# Patient Record
Sex: Male | Born: 1985 | Race: White | Hispanic: No | Marital: Married | Smoking: Never smoker
Health system: Southern US, Community
[De-identification: ages and names within clinical notes are randomized; demographics above are authoritative.]

## PROBLEM LIST (undated history)

## (undated) DIAGNOSIS — R519 Headache, unspecified: Secondary | ICD-10-CM

## (undated) HISTORY — DX: Headache, unspecified: R51.9

---

## 2006-01-15 HISTORY — PX: OTHER SURGICAL HISTORY: SHX169

## 2011-04-30 ENCOUNTER — Encounter (HOSPITAL_COMMUNITY): Payer: Self-pay | Admitting: Physical Medicine and Rehabilitation

## 2011-04-30 ENCOUNTER — Emergency Department (HOSPITAL_COMMUNITY)
Admission: EM | Admit: 2011-04-30 | Discharge: 2011-04-30 | Disposition: A | Discharge status: Home / Self Care | Payer: 59 | Attending: Emergency Medicine | Admitting: Emergency Medicine

## 2011-04-30 DIAGNOSIS — R002 Palpitations: Secondary | ICD-10-CM | POA: Insufficient documentation

## 2011-04-30 DIAGNOSIS — I499 Cardiac arrhythmia, unspecified: Secondary | ICD-10-CM | POA: Insufficient documentation

## 2011-04-30 DIAGNOSIS — I4892 Unspecified atrial flutter: Secondary | ICD-10-CM | POA: Insufficient documentation

## 2011-04-30 DIAGNOSIS — X58XXXA Exposure to other specified factors, initial encounter: Secondary | ICD-10-CM | POA: Insufficient documentation

## 2011-04-30 DIAGNOSIS — F17200 Nicotine dependence, unspecified, uncomplicated: Secondary | ICD-10-CM | POA: Insufficient documentation

## 2011-04-30 MED ORDER — ASPIRIN 81 MG PO CHEW
324.0000 mg | CHEWABLE_TABLET | Freq: Once | ORAL | Status: AC
  Administered 2011-04-30: 324 mg via ORAL
  Filled 2011-04-30: qty 4

## 2011-04-30 MED ORDER — ASPIRIN 81 MG PO CHEW: 325.0000 mg | CHEWABLE_TABLET | Freq: Every day | ORAL | Status: AC

## 2011-04-30 NOTE — ED Provider Notes (Signed)
History     CSN: [id number]  Arrival date & time 04/30/11  1121   First MD Initiated Contact with Patient 04/30/11 1423      Chief Complaint  Patient presents with  . Palpitations     Patient is a 26 y.o. male presenting with palpitations. The history is provided by the patient.  Palpitations  This is a new problem. The current episode started 6 to 12 hours ago. The problem occurs hourly. The problem has been gradually improving. The problem is associated with stress. Episode Length: several hours. Associated symptoms include irregular heartbeat. Pertinent negatives include no diaphoresis, no fever, no chest pressure, no near-syncope, no abdominal pain, no vomiting, no weakness, no cough and no shortness of breath. Risk factors include smoking/tobacco exposure. His past medical history does not include heart disease.    Pt presents for palpitations He reports this morning he noticed his heart racing and it seemed irregular No syncope No CP reported No h/o exertional syncope He has never had this before He does report starting to smoke cigarettes last week No drug use reported Does not use caffeine excessively No recent travel/surgery reported  PMH - none  No past surgical history on file.  History reviewed. No pertinent family history.  History  Substance Use Topics  . Smoking status: Current Some Day Smoker    Types: Cigarettes  . Smokeless tobacco: Not on file  . Alcohol Use: No      Review of Systems  Constitutional: Negative for fever and diaphoresis.  Respiratory: Negative for cough and shortness of breath.   Cardiovascular: Positive for palpitations. Negative for near-syncope.  Gastrointestinal: Negative for vomiting and abdominal pain.  Neurological: Negative for weakness.  All other systems reviewed and are negative.    Allergies  Review of patient's allergies indicates no known allergies.  Home Medications  No current outpatient prescriptions on  file.  BP 118/71  Temp(Src) 98 F (36.7 C) (Oral)  Resp 18  SpO2 97%  Physical Exam CONSTITUTIONAL: Well developed/well nourished HEAD AND FACE: Normocephalic/atraumatic EYES: EOMI/PERRL ENMT: Mucous membranes moist NECK: supple no meningeal signs SPINE:entire spine nontender CV: S1/S2 noted, no murmurs/rubs/gallops noted, regular LUNGS: Lungs are clear to auscultation bilaterally, no apparent distress ABDOMEN: soft, nontender, no rebound or guarding GU:no cva tenderness NEURO: Pt is awake/alert, moves all extremitiesx4 EXTREMITIES: pulses normal, full ROM SKIN: warm, color normal PSYCH: no abnormalities of mood noted  ED Course  Procedures   3:15 PM Initial EKG concerning aflutter Repeat EKG now in sinus He is well appearing, no distress Consulted cardiology  3:56 PM D/w dr Sharyn Lull We discussed EKG, and that he is currently in sinus rhythm He is in no distress, no other medical problems He recommended starting daily ASA and will see in office This was d/w patient He is agreeable with plan   MDM  Nursing notes reviewed and considered in documentation   Date: 04/30/2011 1132am  Rate: 95  Rhythm: atrial flutter  QRS Axis: normal  Intervals: normal  ST/T Wave abnormalities: nonspecific ST changes  Conduction Disutrbances:none  Narrative Interpretation:   Old EKG Reviewed: none available   Date: 04/30/2011 @1454   Rate: 62  Rhythm: normal sinus rhythm  QRS Axis: normal  Intervals: normal  ST/T Wave abnormalities: normal  Conduction Disutrbances:none  Narrative Interpretation:   Old EKG Reviewed: changes noted          Joya Gaskins, MD 04/30/11 1557

## 2011-04-30 NOTE — ED Notes (Signed)
Pt presents to department for evaluation of palpitations. States onset this morning @07 :00 after waking up. States he feels like his heart is "racing." Also states SOB. Denies chest pain at the time. Respirations unlabored. Speaking complete sentences. He is alert and oriented x4. Skin warm and dry. No signs of distress at the time.

## 2011-04-30 NOTE — Discharge Instructions (Signed)
Atrial Flutter  Atrial flutter is a heart rhythm that can cause the heart to beat very fast (tachycardia). It originates in the upper chambers of the heart (atria). In atrial flutter, the top chambers of the heart (atria) often beat much faster than the bottom chambers of the heart (ventricles). Atrial flutter has a regular "saw toothed" appearance in an EKG readout. An EKG is a test that records the electrical activity of the heart. Atrial flutter can cause the heart to beat up to 150 beats per minute (BPM). Atrial flutter can either be short lived (paroxysmal) or permanent.   CAUSES   Causes of atrial flutter can be many. Some of these include:   Heart related issues:   Heart attack (myocardial infarction).   Heart failure.   Heart valve problems.   Poorly controlled high blood pressure (hypertension).   Afteropen heart surgery.   Lung related issues:   A blood clot in the lungs (pulmonary embolism).   Chronic obstructive pulmonary disease (COPD). Medications used to treat COPD can attribute to atrial flutter.   Other related causes:   Hyperthyroidism.   Caffeine.   Some decongestant cold medications.   Low electrolyte levels such as potassium or magnesium.   Cocaine.  SYMPTOMS   An awareness of your heart beating rapidly (palpitations).   Shortness of breath.   Chest pain.   Low blood pressure (hypotension).   Dizziness or fainting.  DIAGNOSIS   Different tests can be performed to diagnose atrial flutter.    An EKG.   Holter monitor. This is a 24 hour recording of your heart rhythm. You will also be given a diary. Write down all symptoms that you have and what you were doing at the time you experienced symptoms.   Cardiac event monitor. This small device can be worn for up to 30 days. When you have heart symptoms, you will push a button on the device. This will then record your heart rhythm.   Echocardiogram. This is an imaging test to look at your heart. Your caregiver will look at your  heart valves and the ventricles.   Stress Test. This test can help determine if the atrial flutter is related to exercise or if coronary artery disease is present.   Laboratory studies will look at certain blood levels like:   Complete blood count (CBC).   Potassium.   Magnesium.   Thyroid function.  TREATMENT   Treatment of atrial flutter varies. A combination of therapies may be used or sometimes atrial flutter may need only 1 type of treatment.   Lab work:  If your blood work, such as your electrolytes (potassium, magnesium) or your thyroid function tests are abnormal, your caregiver will treat them accordingly.   Medication:   There are several different types of medications that can convert your heart to a normal rhythm and prevent atrial flutter from reoccurring.   Nonsurgical procedures:  Nonsurgical techniques may be used to control atrial flutter. Some examples include:   Cardioversion. This technique uses either drugs or an electrical shock to restore a normal heart rhythm:   Cardioversion drugs may be given through an intravenous (IV) line to help "reset" the heart rhythm.   In electrical cardioversion, your caregiver shocks your heart with electrical energy. This helps to reset the heartbeat to a normal rhythm.   Ablation. If atrial flutter is a persistent problem, an ablation may be needed. This procedure is done under mild sedation. High frequency radio-wave energy is used to   destroy the area of heart tissue responsible for atrial flutter.  SEEK IMMEDIATE MEDICAL CARE IF:    Dizziness.   Near fainting or fainting.   Shortness of breath.   Chest pain or pressure.   Sudden nausea or vomiting.   Profuse sweating.  If you have the above symptoms, call your local emergency service immediately! Do not drive yourself to the hospital.  MAKE SURE YOU:    Understand these instructions.   Will watch your condition.   Will get help right away if you are not doing well or get worse.  Document  Released: 05/20/2008 Document Revised: 12/21/2010 Document Reviewed: 05/20/2008  [employer] Patient Information 2012 [employer].

## 2015-04-28 ENCOUNTER — Encounter: Payer: Self-pay | Admitting: Sports Medicine

## 2015-04-28 ENCOUNTER — Ambulatory Visit (INDEPENDENT_AMBULATORY_CARE_PROVIDER_SITE_OTHER): Payer: Managed Care, Other (non HMO) | Admitting: Sports Medicine

## 2015-04-28 DIAGNOSIS — L600 Ingrowing nail: Secondary | ICD-10-CM

## 2015-04-28 DIAGNOSIS — M79674 Pain in right toe(s): Secondary | ICD-10-CM | POA: Diagnosis not present

## 2015-04-28 NOTE — Progress Notes (Signed)
Patient ID: Justin Huffman, male   DOB: January 17, 1985, 30 y.o.   MRN: [id number] Subjective: Justin Huffman is a 30 y.o. male patient presents to office today complaining of a painful incurvated, swollen lateral>medial nail border of the 1st toe on the right foot. This has been present for several weeks. Patient has treated this by trimming. Admits that he had a past procedure on the left side and is well aware that he will need a procedure for the right side. However, he has just been putting it off. States that it is likely from his excessive activities of building a fence from being up on his toes could've caused excessive pressure and contributed to the nail growing into the skin. Patient denies fever/chills/nausea/vomitting/any other related constitutional symptoms at this time.  There are no active problems to display for this patient.   No current outpatient prescriptions on file prior to visit.   No current facility-administered medications on file prior to visit.    No Known Allergies  Objective:  There were no vitals filed for this visit.  General: Well developed, nourished, in no acute distress, alert and oriented x3   Dermatology: Skin is warm, dry and supple bilateral. Right hallux nail appears to be  severely incurvated with hyperkeratosis formation at the distal aspects of the lateral>medial nail border. (-) Erythema. (+) Edema. (-) serosanguous drainage present. The remaining nails appear unremarkable at this time. There are no open sores, lesions or other signs of infection present.  Vascular: Dorsalis Pedis artery and Posterior Tibial artery pedal pulses are 2/4 bilateral with immedate capillary fill time. Pedal hair growth present. No lower extremity edema.   Neruologic: Grossly intact via light touch bilateral.  Musculoskeletal: Tenderness to palpation of the Right hallux lateral>medial nail fold(s). Muscular strength within normal limits in all groups bilateral.    Assesement and Plan: Problem List Items Addressed This Visit    None    Visit Diagnoses    Ingrown nail    -  Primary    right lateral>medial hallux    Toe pain, right           -Discussed treatment alternatives and plan of care; Explained permanent/temporary nail avulsion and post procedure course to patient. Patient opt for permanent nail avulsion at the lateral nail fold of the right hallux and also asked me to just trim the medial nail at the right hallux.  - After a verbal consent, injected 3 ml of a 50:50 mixture of 2% plain  lidocaine and 0.5% plain marcaine in a normal hallux block fashion. Next, a  betadine prep was performed. Anesthesia was tested and found to be appropriate.  The offending right hallux lateral nail border was then incised from the hyponychium to the epinychium. The offending nail border was removed and cleared from the field. The area was curretted for any remaining nail or spicules. Phenol application performed. Following an aggressive slant back was performed at the medial hallux nail of right hallux and then areas were then flushed with alcohol and dressed with antibiotic cream and a dry sterile dressing. -Patient was instructed to leave the dressing intact for today and begin soaking  in a weak solution of betadine and water tomorrow. Patient was instructed to  soak for 15 minutes each day and apply neosporin and a gauze or bandaid dressing each day. -Patient was instructed to monitor the toe for signs of infection and return to office or go to ER if toe becomes red, hot or swollen. -  Recommend ice, elevation, Tylenol or Motrin if needed for pain -Patient is to return in 1-2 weeks for follow up care or sooner if problems arise.  Asencion Islam, DPM

## 2015-04-28 NOTE — Patient Instructions (Signed)

## 2015-05-12 ENCOUNTER — Ambulatory Visit: Payer: Managed Care, Other (non HMO) | Admitting: Sports Medicine

## 2015-05-19 ENCOUNTER — Encounter: Payer: Self-pay | Admitting: Sports Medicine

## 2015-05-19 ENCOUNTER — Ambulatory Visit (INDEPENDENT_AMBULATORY_CARE_PROVIDER_SITE_OTHER): Payer: Managed Care, Other (non HMO) | Admitting: Sports Medicine

## 2015-05-19 DIAGNOSIS — M79674 Pain in right toe(s): Secondary | ICD-10-CM

## 2015-05-19 DIAGNOSIS — Z9889 Other specified postprocedural states: Secondary | ICD-10-CM

## 2015-05-19 NOTE — Progress Notes (Signed)
Patient ID: Justin Huffman, male   DOB: 08/02/1985, 30 y.o.   MRN: [id number] Subjective: Justin Huffman is a 30 y.o. male patient returns to office today for follow up evaluation after having Right Hallux lateral permanent nail avulsion performed on 04-28-15. Patient has been soaking using betadine and applying topical antibiotic covered with bandaid daily until a few days ago. Patient denies fever/chills/nausea/vomitting/any other related constitutional symptoms at this time.  There are no active problems to display for this patient.   Current Outpatient Prescriptions on File Prior to Visit  Medication Sig Dispense Refill  . levocetirizine (XYZAL) 2.5 MG/5ML solution Take 2.5 mg by mouth as needed for allergies.     No current facility-administered medications on file prior to visit.    No Known Allergies  Objective:  General: Well developed, nourished, in no acute distress, alert and oriented x3   Dermatology: Skin is warm, dry and supple bilateral. Right  hallux lateral nail bed appears to be clean, dry, with mild granular tissue and surrounding eschar/scab. (-) Erythema. (-) Edema. (-) serosanguous drainage present. The remaining nails appear unremarkable at this time. There are no other lesions or other signs of infection present.  Neurovascular status: Intact. No lower extremity swelling; No pain with calf compression bilateral.  Musculoskeletal: Decreased tenderness to palpation of the Right hallux lateral nail fold. Muscular strength within normal limits bilateral.   Assesement and Plan: Problem List Items Addressed This Visit    None    Visit Diagnoses    S/P nail surgery    -  Primary    PNA right hallux lateral margin    Toe pain, right           -Examined patient  -Cleansed right hallux lateral nail fold and gently scrubbed with peroxide and curetted away eschar at site and applied antibiotic cream covered with bandaid.  -Discussed plan of care with  patient. -Soaks no longer needed -Bandaid as needed. May leave open to air at night. -Educated patient on long term care after nail surgery. -Patient was instructed to monitor the toe for reoccurrence and signs of infection; Patient advised to return to office or go to ER if toe becomes red, hot or swollen. -Patient is to return as needed or sooner if problems arise.  Asencion Islamitorya Indiyah Paone, DPM

## 2017-01-03 DIAGNOSIS — M79661 Pain in right lower leg: Secondary | ICD-10-CM | POA: Diagnosis not present

## 2017-01-03 DIAGNOSIS — E668 Other obesity: Secondary | ICD-10-CM | POA: Diagnosis not present

## 2017-01-03 DIAGNOSIS — R8290 Unspecified abnormal findings in urine: Secondary | ICD-10-CM | POA: Diagnosis not present

## 2017-01-03 DIAGNOSIS — Z1389 Encounter for screening for other disorder: Secondary | ICD-10-CM | POA: Diagnosis not present

## 2017-01-03 DIAGNOSIS — Z0000 Encounter for general adult medical examination without abnormal findings: Secondary | ICD-10-CM | POA: Diagnosis not present

## 2017-01-03 DIAGNOSIS — Z6831 Body mass index (BMI) 31.0-31.9, adult: Secondary | ICD-10-CM | POA: Diagnosis not present

## 2017-03-25 DIAGNOSIS — J329 Chronic sinusitis, unspecified: Secondary | ICD-10-CM | POA: Diagnosis not present

## 2017-03-25 DIAGNOSIS — Z6831 Body mass index (BMI) 31.0-31.9, adult: Secondary | ICD-10-CM | POA: Diagnosis not present

## 2017-03-25 DIAGNOSIS — H748X1 Other specified disorders of right middle ear and mastoid: Secondary | ICD-10-CM | POA: Diagnosis not present

## 2017-06-11 DIAGNOSIS — H04123 Dry eye syndrome of bilateral lacrimal glands: Secondary | ICD-10-CM | POA: Diagnosis not present

## 2017-07-02 DIAGNOSIS — Z6830 Body mass index (BMI) 30.0-30.9, adult: Secondary | ICD-10-CM | POA: Diagnosis not present

## 2017-07-02 DIAGNOSIS — H04129 Dry eye syndrome of unspecified lacrimal gland: Secondary | ICD-10-CM | POA: Diagnosis not present

## 2017-07-02 DIAGNOSIS — J329 Chronic sinusitis, unspecified: Secondary | ICD-10-CM | POA: Diagnosis not present

## 2017-07-02 DIAGNOSIS — E668 Other obesity: Secondary | ICD-10-CM | POA: Diagnosis not present

## 2017-07-03 ENCOUNTER — Other Ambulatory Visit: Payer: Self-pay | Admitting: Internal Medicine

## 2017-07-03 DIAGNOSIS — J329 Chronic sinusitis, unspecified: Secondary | ICD-10-CM

## 2017-08-28 DIAGNOSIS — R51 Headache: Secondary | ICD-10-CM | POA: Diagnosis not present

## 2017-08-28 DIAGNOSIS — J310 Chronic rhinitis: Secondary | ICD-10-CM | POA: Diagnosis not present

## 2017-08-28 DIAGNOSIS — J343 Hypertrophy of nasal turbinates: Secondary | ICD-10-CM | POA: Diagnosis not present

## 2018-02-03 DIAGNOSIS — M2560 Stiffness of unspecified joint, not elsewhere classified: Secondary | ICD-10-CM | POA: Diagnosis not present

## 2018-02-03 DIAGNOSIS — R51 Headache: Secondary | ICD-10-CM | POA: Diagnosis not present

## 2018-02-03 DIAGNOSIS — M542 Cervicalgia: Secondary | ICD-10-CM | POA: Diagnosis not present

## 2018-02-03 DIAGNOSIS — M6281 Muscle weakness (generalized): Secondary | ICD-10-CM | POA: Diagnosis not present

## 2018-02-20 ENCOUNTER — Other Ambulatory Visit: Payer: Self-pay | Admitting: Otolaryngology

## 2018-02-20 DIAGNOSIS — J329 Chronic sinusitis, unspecified: Secondary | ICD-10-CM

## 2018-03-13 DIAGNOSIS — M542 Cervicalgia: Secondary | ICD-10-CM | POA: Diagnosis not present

## 2018-03-13 DIAGNOSIS — M6281 Muscle weakness (generalized): Secondary | ICD-10-CM | POA: Diagnosis not present

## 2018-03-13 DIAGNOSIS — R51 Headache: Secondary | ICD-10-CM | POA: Diagnosis not present

## 2018-03-13 DIAGNOSIS — M2560 Stiffness of unspecified joint, not elsewhere classified: Secondary | ICD-10-CM | POA: Diagnosis not present

## 2018-04-08 DIAGNOSIS — M6281 Muscle weakness (generalized): Secondary | ICD-10-CM | POA: Diagnosis not present

## 2018-04-08 DIAGNOSIS — M542 Cervicalgia: Secondary | ICD-10-CM | POA: Diagnosis not present

## 2018-04-08 DIAGNOSIS — M2560 Stiffness of unspecified joint, not elsewhere classified: Secondary | ICD-10-CM | POA: Diagnosis not present

## 2018-04-08 DIAGNOSIS — R51 Headache: Secondary | ICD-10-CM | POA: Diagnosis not present

## 2018-06-17 DIAGNOSIS — Z20828 Contact with and (suspected) exposure to other viral communicable diseases: Secondary | ICD-10-CM | POA: Diagnosis not present

## 2018-07-09 DIAGNOSIS — Z20828 Contact with and (suspected) exposure to other viral communicable diseases: Secondary | ICD-10-CM | POA: Diagnosis not present

## 2018-07-11 ENCOUNTER — Other Ambulatory Visit: Payer: Self-pay

## 2018-07-15 ENCOUNTER — Ambulatory Visit
Admission: RE | Admit: 2018-07-15 | Discharge: 2018-07-15 | Disposition: A | Discharge status: Home / Self Care | Payer: BC Managed Care – PPO | Source: Ambulatory Visit | Attending: Otolaryngology | Admitting: Otolaryngology

## 2018-07-15 DIAGNOSIS — J329 Chronic sinusitis, unspecified: Secondary | ICD-10-CM | POA: Diagnosis not present

## 2018-08-25 ENCOUNTER — Encounter: Payer: Self-pay | Admitting: *Deleted

## 2018-08-27 ENCOUNTER — Other Ambulatory Visit: Payer: Self-pay

## 2018-08-27 ENCOUNTER — Ambulatory Visit: Payer: BC Managed Care – PPO | Admitting: Diagnostic Neuroimaging

## 2018-08-27 ENCOUNTER — Encounter: Payer: Self-pay | Admitting: Diagnostic Neuroimaging

## 2018-08-27 VITALS — BP 136/88 | HR 77 | Temp 96.8°F | Ht 74.0 in | Wt 241.0 lb

## 2018-08-27 DIAGNOSIS — R51 Headache: Secondary | ICD-10-CM

## 2018-08-27 DIAGNOSIS — G43009 Migraine without aura, not intractable, without status migrainosus: Secondary | ICD-10-CM

## 2018-08-27 DIAGNOSIS — R519 Headache, unspecified: Secondary | ICD-10-CM

## 2018-08-27 MED ORDER — RIZATRIPTAN BENZOATE 10 MG PO TBDP: 10.0000 mg | ORAL_TABLET | ORAL | 11 refills | Status: AC | PRN

## 2018-08-27 MED ORDER — TOPIRAMATE 50 MG PO TABS: 50.0000 mg | ORAL_TABLET | Freq: Two times a day (BID) | ORAL | 12 refills | Status: AC

## 2018-08-27 NOTE — Patient Instructions (Signed)
-   check MRI brain (with and without)   - MIGRAINE PREVENTION --> start topiramate 50mg  at bedtime; after 1 week increase to twice a day; drink plenty of water  - MIGRAINE RESCUE --> rizatriptan 10mg  as needed for breakthrough headache; may repeat x 1 after 2 hours; max 2 tabs per day or 8 per month

## 2018-08-27 NOTE — Progress Notes (Signed)
[facility]  PATIENT: Justin Huffman DOB: December 10, 1985  REFERRING CLINICIAN: Teoh HISTORY FROM: patient  REASON FOR VISIT: new consult    HISTORICAL  CHIEF COMPLAINT:  Chief Complaint  Patient presents with  . Headache    rm 7 New Pt,  "since Feb 2019 having daily headaches, Tylenol gives a little relief"    HISTORY OF PRESENT ILLNESS:   33 year old male here for evaluation of headaches.  Headache started in February 2019.  He describes head pressure, squeezing sensation on the left side of his head, eye, ear, neck and shoulder.  Symptoms can last 2 to 3 hours at a time.  He has associated photophobia, dizziness, word finding difficulty.  Now patient having headaches on a daily basis.  Initially he felt some sinus pressure and ear pressure sensation, went to ENT for evaluation.  No specific pathology was found.  Caffeine tends to trigger these headaches.  Looking at computer screen tends to trigger the headaches.  Patient has family history of migraine in his mother.  Patient does not have any similar history of headaches prior to 2019.  He has tried Tylenol and muscle relaxers without relief.  No other prodromal accidents injuries or traumas.  No change in stress sleep or exercise.   REVIEW OF SYSTEMS: Full 14 system review of systems performed and negative with exception of: As per HPI.  ALLERGIES: No Known Allergies  HOME MEDICATIONS: Outpatient Medications Prior to Visit  Medication Sig Dispense Refill  . IBU 400 MG tablet TAKE 1 TABLET BY MOUTH EVERY 8 HOURS FOR 3 DAYS, TAKE 1 TABLET EVERY TWELVE (12) HOURS DAILY FOR 3 DAYS, TAKE 1 TABLET 3 TIMES DAILY AS NEED    . levocetirizine (XYZAL) 2.5 MG/5ML solution Take 2.5 mg by mouth as needed for allergies.    . methocarbamol (ROBAXIN) 750 MG tablet TAKE 1-2 TABLETS BY MOUTH EVERY NIGHT AT BEDTIME AS NEEDED     No facility-administered medications prior to visit.     PAST MEDICAL HISTORY: Past  Medical History:  Diagnosis Date  . Headache     PAST SURGICAL HISTORY: Past Surgical History:  Procedure Laterality Date  . arm surgery Right 2008   ligament replacement    FAMILY HISTORY: Family History  Problem Relation Age of Onset  . Migraines Mother   . Lung cancer Maternal Grandmother        smoker    SOCIAL HISTORY: Social History   Socioeconomic History  . Marital status: Married    Spouse name: Not on file  . Number of children: 1  . Years of education: Not on file  . Highest education level: Bachelor's degree (e.g., BA, AB, BS)  Occupational History    Comment: Estate agent [place]  Social Needs  . Financial resource strain: Not on file  . Food insecurity    Worry: Not on file    Inability: Not on file  . Transportation needs    Medical: Not on file    Non-medical: Not on file  Tobacco Use  . Smoking status: Never Smoker  . Smokeless tobacco: Never Used  Substance and Sexual Activity  . Alcohol use: No    Comment: 08/2018 1-2 x week or less  . Drug use: No  . Sexual activity: Not on file  Lifestyle  . Physical activity    Days per week: Not on file    Minutes per session: Not on file  . Stress: Not on file  Relationships  .  Social Musicianconnections    Talks on phone: Not on file    Gets together: Not on file    Attends religious service: Not on file    Active member of club or organization: Not on file    Attends meetings of clubs or organizations: Not on file    Relationship status: Not on file  . Intimate partner violence    Fear of current or ex partner: Not on file    Emotionally abused: Not on file    Physically abused: Not on file    Forced sexual activity: Not on file  Other Topics Concern  . Not on file  Social History Narrative   Lives with wife, child   Caffeine- 32 oz coffee     PHYSICAL EXAM  GENERAL EXAM/CONSTITUTIONAL: Vitals:  Vitals:   08/27/18 0819  BP: 136/88  Pulse: 77  Temp:  (!) 96.8 F (36 C)  Weight: 241 lb (109.3 kg)  Height: 6\' 2"  (1.88 m)     Body mass index is 30.94 kg/m. Wt Readings from Last 3 Encounters:  08/27/18 241 lb (109.3 kg)     Patient is in no distress; well developed, nourished and groomed; neck is supple  CARDIOVASCULAR:  Examination of carotid arteries is normal; no carotid bruits  Regular rate and rhythm, no murmurs  Examination of peripheral vascular system by observation and palpation is normal  EYES:  Ophthalmoscopic exam of optic discs and posterior segments is normal; no papilledema or hemorrhages  No exam data present  MUSCULOSKELETAL:  Gait, strength, tone, movements noted in Neurologic exam below  NEUROLOGIC: MENTAL STATUS:  No flowsheet data found.  awake, alert, oriented to person, place and time  recent and remote memory intact  normal attention and concentration  language fluent, comprehension intact, naming intact  fund of knowledge appropriate  CRANIAL NERVE:   2nd - no papilledema on fundoscopic exam  2nd, 3rd, 4th, 6th - pupils equal and reactive to light, visual fields full to confrontation, extraocular muscles intact, no nystagmus  5th - facial sensation symmetric  7th - facial strength symmetric; INTERMITTENT LEFT EYE BLEPHAROSPASM  8th - hearing intact  9th - palate elevates symmetrically, uvula midline  11th - shoulder shrug symmetric  12th - tongue protrusion midline  MOTOR:   normal bulk and tone, full strength in the BUE, BLE  SENSORY:   normal and symmetric to light touch, temperature, vibration  COORDINATION:   finger-nose-finger, fine finger movements normal  REFLEXES:   deep tendon reflexes present and symmetric  GAIT/STATION:   narrow based gait     DIAGNOSTIC DATA (LABS, IMAGING, TESTING) - I reviewed patient records, labs, notes, testing and imaging myself where available.  No results found for: WBC, HGB, HCT, MCV, PLT No results found for:  NA, K, CL, CO2, GLUCOSE, BUN, CREATININE, CALCIUM, PROT, ALBUMIN, AST, ALT, ALKPHOS, BILITOT, GFRNONAA, GFRAA No results found for: CHOL, HDL, LDLCALC, LDLDIRECT, TRIG, CHOLHDL No results found for: [id number] No results found for: VITAMINB12 No results found for: TSH   07/15/18 CT sinuses - Negative examination. No evidence of inflammatory sinus disease. Incidental retention cyst at the maxillary sinus floors.    ASSESSMENT AND PLAN  33 y.o. year old male here with new onset left-sided headaches associated with photophobia, dizziness, word finding difficulties, since February 2019.  Most likely represents migraine without aura.  Will check MRI of the brain to rule out other secondary causes.  Dx:  1. New onset of headaches   2.  Migraine without aura and without status migrainosus, not intractable     PLAN:  - check MRI brain (with and without); rule out mass, inflamm, infarct  - MIGRAINE PREVENTION --> start topiramate 50mg  at bedtime; after 1 week increase to twice a day; drink plenty of water  - MIGRAINE RESCUE --> rizatriptan 10mg  as needed for breakthrough headache; may repeat x 1 after 2 hours; max 2 tabs per day or 8 per month   Orders Placed This Encounter  Procedures  . MR BRAIN W WO CONTRAST   Meds ordered this encounter  Medications  . rizatriptan (MAXALT-MLT) 10 MG disintegrating tablet    Sig: Take 1 tablet (10 mg total) by mouth as needed for migraine. May repeat in 2 hours if needed    Dispense:  9 tablet    Refill:  11  . topiramate (TOPAMAX) 50 MG tablet    Sig: Take 1 tablet (50 mg total) by mouth 2 (two) times daily.    Dispense:  60 tablet    Refill:  12   Return in about 4 months (around 12/27/2018) for with NP (Amy Lomax).    Suanne MarkerVIKRAM R. , MD 08/27/2018, 9:08 AM Certified in Neurology, Neurophysiology and Neuroimaging  [facility] [address], [address] [place], [place] [address] [phone]

## 2018-08-28 ENCOUNTER — Telehealth: Payer: Self-pay | Admitting: Diagnostic Neuroimaging

## 2018-08-28 NOTE — Telephone Encounter (Signed)
LVM for pt to call back about scheduling mri  [employer] Auth: [employer] via [employer] website  °

## 2018-08-28 NOTE — Telephone Encounter (Signed)
I left a voicemail for patient to return my call.

## 2018-08-28 NOTE — Telephone Encounter (Signed)
Pt has called Justin Huffman back, he is asking for a call back °

## 2018-10-13 DIAGNOSIS — M546 Pain in thoracic spine: Secondary | ICD-10-CM | POA: Diagnosis not present

## 2018-10-13 DIAGNOSIS — M542 Cervicalgia: Secondary | ICD-10-CM | POA: Diagnosis not present

## 2018-12-28 NOTE — Progress Notes (Deleted)
PATIENT: Justin Huffman DOB: [birth year]  REASON FOR VISIT: follow up HISTORY FROM: patient  No chief complaint on file.    HISTORY OF PRESENT ILLNESS: Today 12/28/18 Justin Huffman is a 33 y.o. male here today for follow up for headaches. He was started on topiramate 50mg  BID and rizatriptan for abortive therapy in 08/2018. MRI    HISTORY: (copied from Dr 09/2018 note on 08/27/2018)  33 year old male here for evaluation of headaches.  Headache started in February 2019.  He describes head pressure, squeezing sensation on the left side of his head, eye, ear, neck and shoulder.  Symptoms can last 2 to 3 hours at a time.  He has associated photophobia, dizziness, word finding difficulty.  Now patient having headaches on a daily basis.  Initially he felt some sinus pressure and ear pressure sensation, went to ENT for evaluation.  No specific pathology was found.  Caffeine tends to trigger these headaches.  Looking at computer screen tends to trigger the headaches.  Patient has family history of migraine in his mother.  Patient does not have any similar history of headaches prior to 2019.  He has tried Tylenol and muscle relaxers without relief.  No other prodromal accidents injuries or traumas.  No change in stress sleep or exercise.    REVIEW OF SYSTEMS: Out of a complete 14 system review of symptoms, the patient complains only of the following symptoms, and all other reviewed systems are negative.  ALLERGIES: No Known Allergies  HOME MEDICATIONS: Outpatient Medications Prior to Visit  Medication Sig Dispense Refill  . IBU 400 MG tablet TAKE 1 TABLET BY MOUTH EVERY 8 HOURS FOR 3 DAYS, TAKE 1 TABLET EVERY TWELVE (12) HOURS DAILY FOR 3 DAYS, TAKE 1 TABLET 3 TIMES DAILY AS NEED    . levocetirizine (XYZAL) 2.5 MG/5ML solution Take 2.5 mg by mouth as needed for allergies.    . methocarbamol (ROBAXIN) 750 MG tablet TAKE 1-2 TABLETS BY MOUTH EVERY NIGHT AT BEDTIME AS  NEEDED    . rizatriptan (MAXALT-MLT) 10 MG disintegrating tablet Take 1 tablet (10 mg total) by mouth as needed for migraine. May repeat in 2 hours if needed 9 tablet 11  . topiramate (TOPAMAX) 50 MG tablet Take 1 tablet (50 mg total) by mouth 2 (two) times daily. 60 tablet 12   No facility-administered medications prior to visit.    PAST MEDICAL HISTORY: Past Medical History:  Diagnosis Date  . Headache     PAST SURGICAL HISTORY: Past Surgical History:  Procedure Laterality Date  . arm surgery Right 2008   ligament replacement    FAMILY HISTORY: Family History  Problem Relation Age of Onset  . Migraines Mother   . Lung cancer Maternal Grandmother        smoker    SOCIAL HISTORY: Social History   Socioeconomic History  . Marital status: Married    Spouse name: Not on file  . Number of children: 1  . Years of education: Not on file  . Highest education level: Bachelor's degree (e.g., BA, AB, BS)  Occupational History    Comment: 2009 [employer]  Tobacco Use  . Smoking status: Never Smoker  . Smokeless tobacco: Never Used  Substance and Sexual Activity  . Alcohol use: No    Comment: 08/2018 1-2 x week or less  . Drug use: No  . Sexual activity: Not on file  Other Topics Concern  . Not on file  Social History Narrative  Lives with wife, child   Caffeine- 32 oz coffee   Social Determinants of Health   Financial Resource Strain:   . Difficulty of Paying Living Expenses: Not on file  Food Insecurity:   . Worried About Charity fundraiser in the Last Year: Not on file  . Ran Out of Food in the Last Year: Not on file  Transportation Needs:   . Lack of Transportation (Medical): Not on file  . Lack of Transportation (Non-Medical): Not on file  Physical Activity:   . Days of Exercise per Week: Not on file  . Minutes of Exercise per Session: Not on file  Stress:   . Feeling of Stress : Not on file  Social Connections:    . Frequency of Communication with Friends and Family: Not on file  . Frequency of Social Gatherings with Friends and Family: Not on file  . Attends Religious Services: Not on file  . Active Member of Clubs or Organizations: Not on file  . Attends Archivist Meetings: Not on file  . Marital Status: Not on file  Intimate Partner Violence:   . Fear of Current or Ex-Partner: Not on file  . Emotionally Abused: Not on file  . Physically Abused: Not on file  . Sexually Abused: Not on file      PHYSICAL EXAM  There were no vitals filed for this visit. There is no height or weight on file to calculate BMI.  Generalized: Well developed, in no acute distress  Cardiology: normal rate and rhythm, no murmur noted Neurological examination  Mentation: Alert oriented to time, place, history taking. Follows all commands speech and language fluent Cranial nerve II-XII: Pupils were equal round reactive to light. Extraocular movements were full, visual field were full on confrontational test. Facial sensation and strength were normal. Uvula tongue midline. Head turning and shoulder shrug  were normal and symmetric. Motor: The motor testing reveals 5 over 5 strength of all 4 extremities. Good symmetric motor tone is noted throughout.  Sensory: Sensory testing is intact to soft touch on all 4 extremities. No evidence of extinction is noted.  Coordination: Cerebellar testing reveals good finger-nose-finger and heel-to-shin bilaterally.  Gait and station: Gait is normal. Tandem gait is normal. Romberg is negative. No drift is seen.  Reflexes: Deep tendon reflexes are symmetric and normal bilaterally.   DIAGNOSTIC DATA (LABS, IMAGING, TESTING) - I reviewed patient records, labs, notes, testing and imaging myself where available.  No flowsheet data found.   No results found for: WBC, HGB, HCT, MCV, PLT No results found for: NA, K, CL, CO2, GLUCOSE, BUN, CREATININE, CALCIUM, PROT, ALBUMIN, AST,  ALT, ALKPHOS, BILITOT, GFRNONAA, GFRAA No results found for: CHOL, HDL, LDLCALC, LDLDIRECT, TRIG, CHOLHDL No results found for: HGBA1C No results found for: VITAMINB12 No results found for: TSH     ASSESSMENT AND PLAN 33 y.o. year old male  has a past medical history of Headache. here with ***  No diagnosis found.     No orders of the defined types were placed in this encounter.    No orders of the defined types were placed in this encounter.     I spent 15 minutes with the patient. 50% of this time was spent counseling and educating patient on plan of care and medications.    Debbora Presto, FNP-C 12/28/2018, 3:09 PM [facility] [address], [place], [place] [address] [phone]

## 2018-12-29 ENCOUNTER — Ambulatory Visit: Payer: BC Managed Care – PPO | Admitting: Family Medicine

## 2018-12-29 ENCOUNTER — Encounter: Payer: Self-pay | Admitting: Family Medicine

## 2018-12-29 ENCOUNTER — Telehealth: Payer: Self-pay

## 2018-12-29 DIAGNOSIS — G43009 Migraine without aura, not intractable, without status migrainosus: Secondary | ICD-10-CM

## 2018-12-29 NOTE — Telephone Encounter (Signed)
Patient was a no call/no show for their appointment today.   

## 2019-07-28 IMAGING — CT CT MAXILLOFACIAL WITHOUT CONTRAST
4 of 5 series · 13 of 47 positions shown, 14 images · non-contrast
Comparison: None.

CLINICAL DATA: Fusion protocol for chronic sinusitis.

EXAM:
CT MAXILLOFACIAL WITHOUT CONTRAST
TECHNIQUE: Multidetector CT images of the paranasal sinuses were obtained using
the standard protocol without intravenous contrast.

[Series 2: sinus 2.00 hr60 s3 axial · axial · 0.43mm/px · z∈[-680,-610]mm · 3 of 71 slices shown]
[im 18/71  bone]
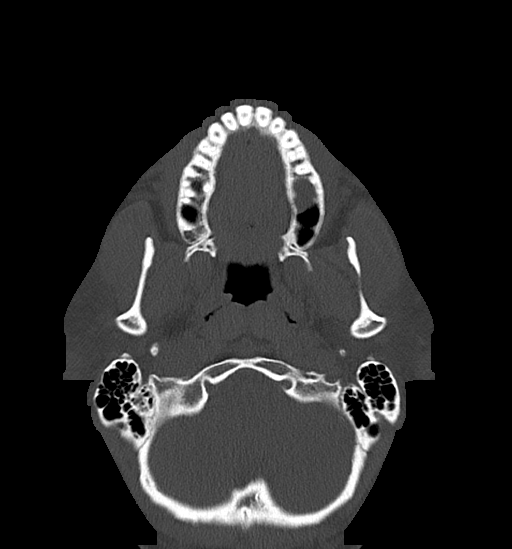
[im 36/71  bone]
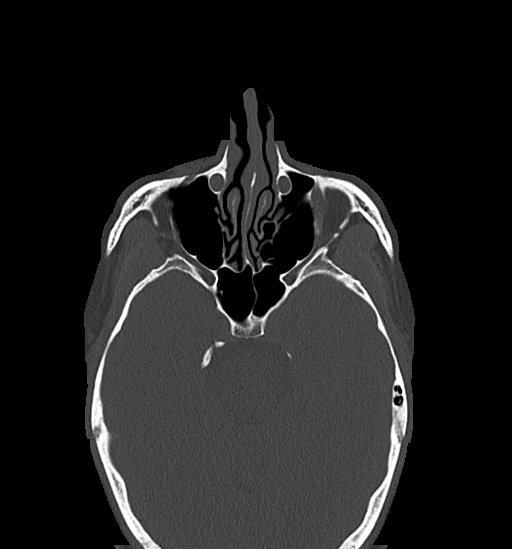
[im 53/71  bone]
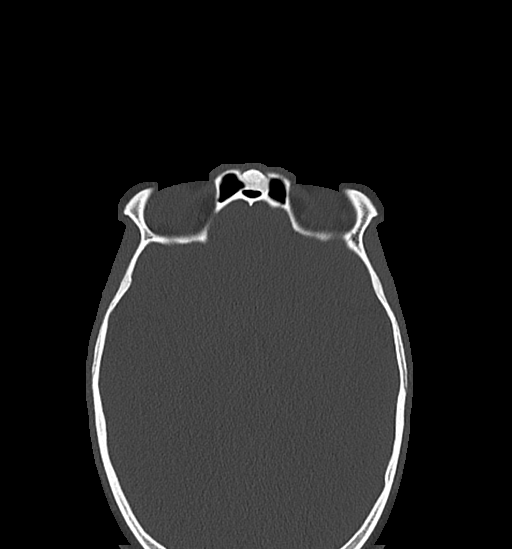

[Series 4: sinus 2.00 hr60 s3 cor · coronal · 0.28mm/px · 3 of 119 slices shown]
[im 40/119  bone]
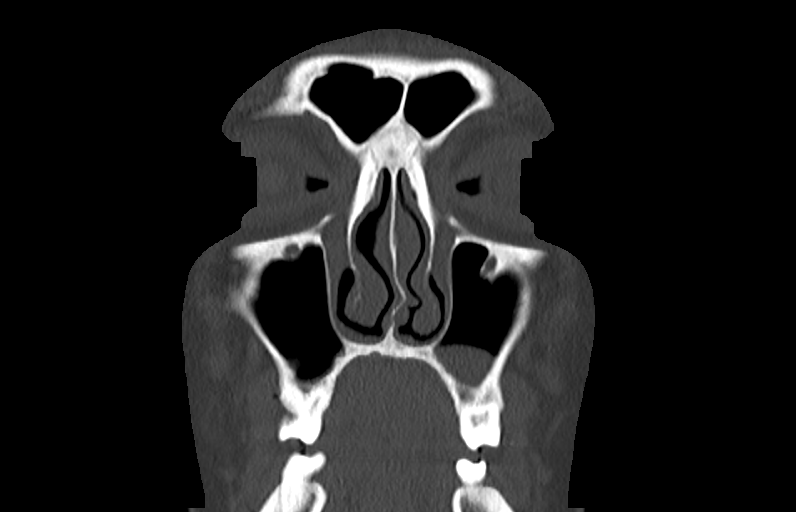
[im 53/119  bone]
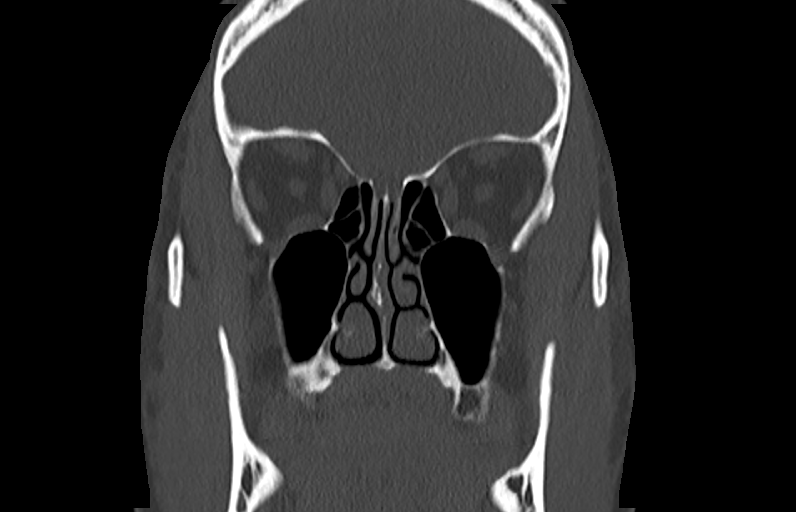
[im 66/119  bone]
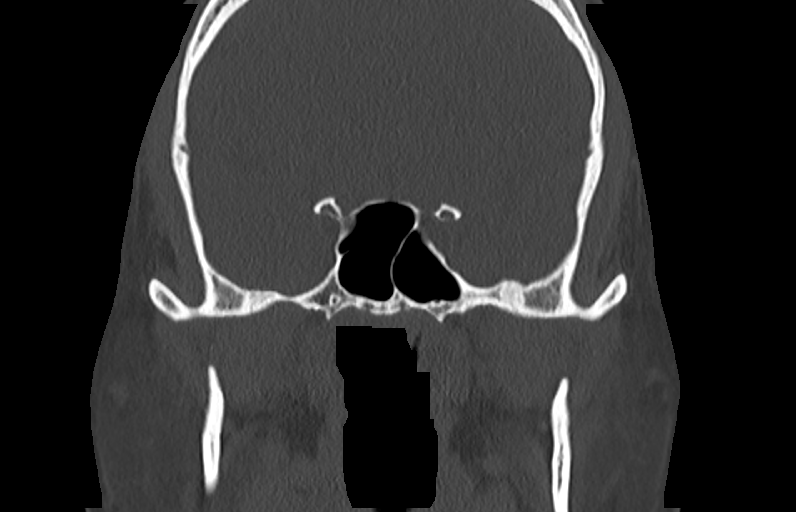

[Series 6: sinus 2.00 hr60 s3 sag · sagittal · 0.28mm/px · 3 of 111 slices shown]
[im 37/111  bone]
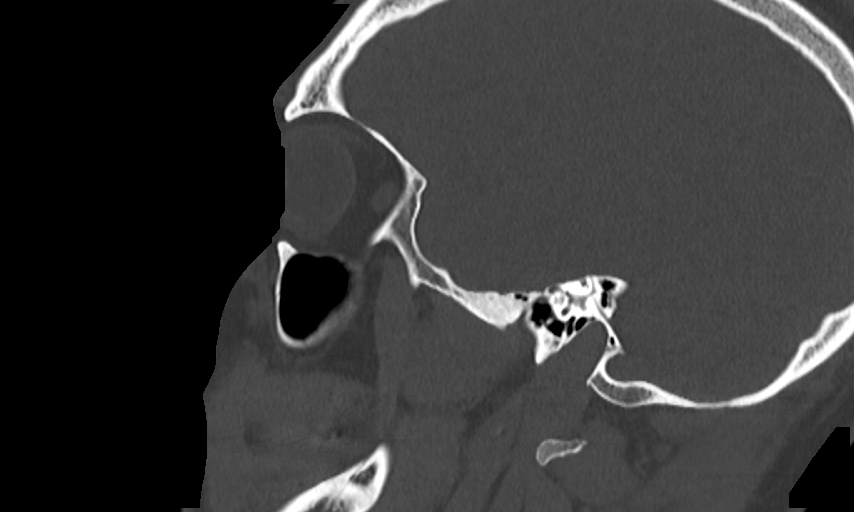
[im 56/111  bone]
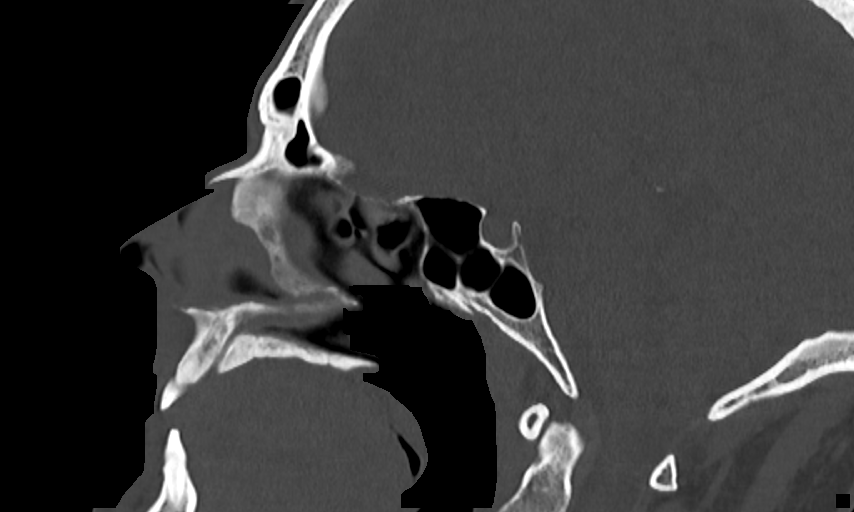
[im 74/111  bone]
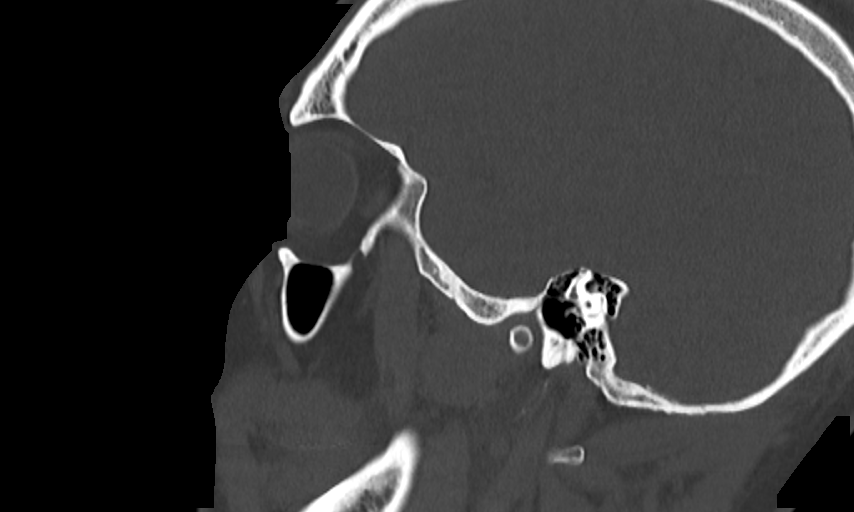

[Series 8: sinus 2.00 br40 s3 axial · axial · 0.43mm/px · z∈[-686,-602]mm · 4 of 71 slices shown, 5 images]
[im 15/71  brain]
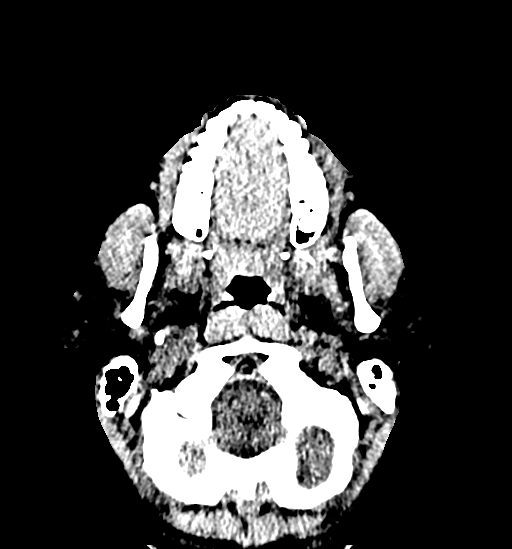
[im 15/71  bone]
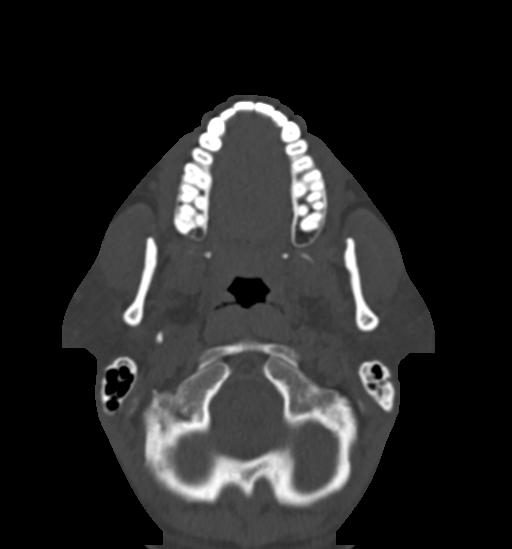
[im 29/71  bone]
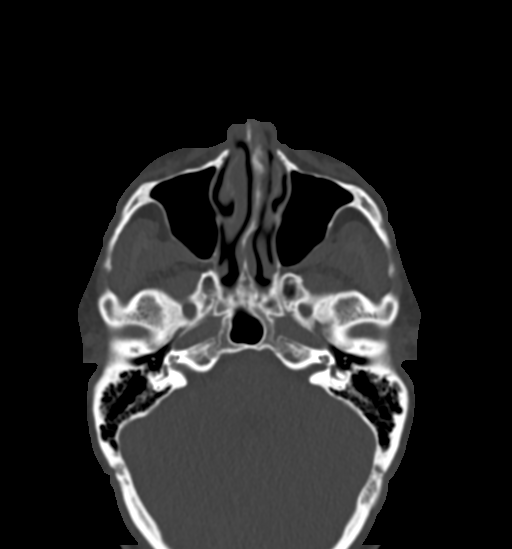
[im 43/71  bone]
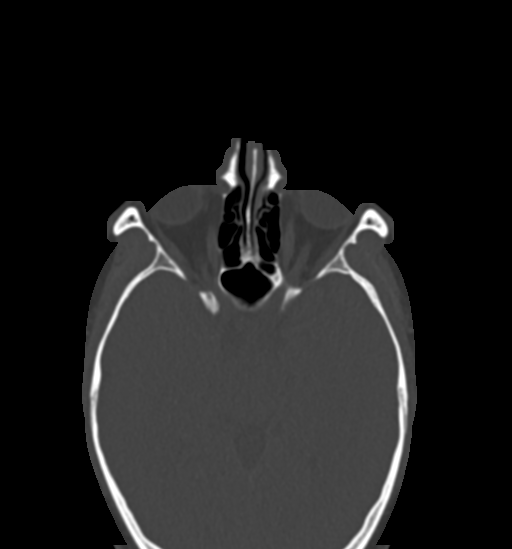
[im 57/71  bone]
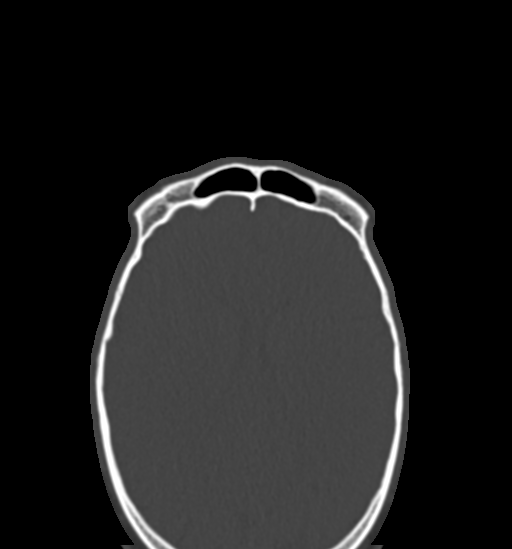

[13 of 47 positions shown; findings below may reference images not displayed]

FINDINGS: Paranasal sinuses:

Frontal: Normally aerated. Patent frontal sinus drainage pathways.

Ethmoid: Normally aerated.

Maxillary: Incidental retention cysts of the maxillary sinus floors.
No widespread mucosal thickening or layering fluid.

Sphenoid: Normally aerated. Patent sphenoethmoidal recesses.

Right ostiomeatal unit: Infundibulum widely patent. No unfavorable
variant.

Left ostiomeatal unit: Infundibulum widely patent. No unfavorable
variant.

Nasal passages: Patent. Nasal septum bows to the left with a septal
spur.

Anatomy: No pneumatization superior to anterior ethmoid notches.
Symmetric and intact olfactory grooves and fovea ethmoidalis, Keros
II (4-7mm) Sellar sphenoid pneumatization pattern. No dehiscence of
carotid or optic canals. No onodi cell.

Other: None
IMPRESSION: Negative examination. No evidence of inflammatory sinus disease.
Incidental retention cyst at the maxillary sinus floors.

## 2020-11-28 ENCOUNTER — Ambulatory Visit (INDEPENDENT_AMBULATORY_CARE_PROVIDER_SITE_OTHER): Payer: Self-pay | Admitting: Podiatry

## 2020-11-28 DIAGNOSIS — Z91199 Patient's noncompliance with other medical treatment and regimen due to unspecified reason: Secondary | ICD-10-CM
